# Patient Record
Sex: Female | Born: 1985 | Race: Black or African American | Hispanic: No | Marital: Single | Smoking: Former smoker
Health system: Southern US, Community
[De-identification: ages and names within clinical notes are randomized; demographics above are authoritative.]

## PROBLEM LIST (undated history)

## (undated) DIAGNOSIS — T7840XA Allergy, unspecified, initial encounter: Secondary | ICD-10-CM

## (undated) DIAGNOSIS — G43909 Migraine, unspecified, not intractable, without status migrainosus: Secondary | ICD-10-CM

## (undated) DIAGNOSIS — J45909 Unspecified asthma, uncomplicated: Secondary | ICD-10-CM

## (undated) HISTORY — DX: Unspecified asthma, uncomplicated: J45.909

## (undated) HISTORY — DX: Migraine, unspecified, not intractable, without status migrainosus: G43.909

## (undated) HISTORY — DX: Allergy, unspecified, initial encounter: T78.40XA

## (undated) HISTORY — PX: WISDOM TOOTH EXTRACTION: SHX21

---

## 2008-04-12 HISTORY — PX: SHOULDER SURGERY: SHX246

## 2012-02-24 ENCOUNTER — Ambulatory Visit: Payer: BC Managed Care – PPO

## 2012-02-24 ENCOUNTER — Ambulatory Visit (INDEPENDENT_AMBULATORY_CARE_PROVIDER_SITE_OTHER): Payer: BC Managed Care – PPO | Admitting: Family Medicine

## 2012-02-24 VITALS — BP 118/66 | HR 89 | Temp 98.5°F | Resp 16 | Ht 61.0 in | Wt 135.8 lb

## 2012-02-24 DIAGNOSIS — M79643 Pain in unspecified hand: Secondary | ICD-10-CM

## 2012-02-24 DIAGNOSIS — M79609 Pain in unspecified limb: Secondary | ICD-10-CM

## 2012-02-24 MED ORDER — TRAMADOL HCL 50 MG PO TABS: 50.0000 mg | ORAL_TABLET | Freq: Three times a day (TID) | ORAL | Status: DC | PRN

## 2012-02-24 NOTE — Patient Instructions (Signed)
Your have an appointment scheduled at [facility] ([phone]) on Tuesday 11/19 at 8 am.  Please arrive at 7:30 am.

## 2012-02-24 NOTE — Progress Notes (Signed)
[facility] [address] [place] [address] [phone]  Date:  02/24/2012   Name:  Heven Dansereau   DOB:  Jun 11, 1985   MRN:  [id number]  PCP:  No primary provider on file.    Chief Complaint: Hand Pain and Extremity Weakness   History of Present Illness:  Kamaia Quiocho is a 26 y.o. very pleasant female patient who presents with the following:  She is here today with a problem with her right hand.  She is concerned that she may have joint hypermobility syndrome as she has noted that many of her joints have hyperextension.   She is "tired all the time and in pain all the time."  This has been the case for several years.  She had surgery on her right shoulder in 2010 to "remove scar tissue pressing on my nerves."  Her procedure was done in [place].  She had PT after the surgery, but has not followed- up recently.  Her surgeon was with [facility] She has had persistent numbness in the ulnar side of her right hand/ forearm for several years.  For the last couple of days she has had more pain, weakness and numbness in her right hand, especially the ring and pinky fingers.  There was no known acute injury  She is right handed. However, she has found herself using her left hand as her right hand is bothering her so much.    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  Her work requires the use of both hands.  She does not think she can manage her job at the moment.   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  Otherwise she is generally healthy.   There is no chance of pregnancy per her report.    [facility] has been taking care of her most recently.  They saw her for a back and hip problem about a year ago- she also had PT that that time  She has not taken any medication at all for her pain so far.  She states that this is because she "has such a high tolerance for pain that I would have to take  too much" of an OTC medication such as ibuprofen.  She has used tramadol in the past with some success.   There is no problem list on file for this patient.   Past Medical History  Diagnosis Date  . Allergy   . Asthma     Past Surgical History  Procedure Date  . Shoulder surgery 2010    History  Substance Use Topics  . Smoking status: Current Every Day Smoker  . Smokeless tobacco: Not on file  . Alcohol Use: Yes    Family History  Problem Relation Age of Onset  . Hyperlipidemia Father   . Hypertension Maternal Grandfather     No Known Allergies  Medication list has been reviewed and updated.  No current outpatient prescriptions on file prior to visit.    Review of Systems:  As per HPI- otherwise negative.   Physical Examination: Filed Vitals:   02/24/12 1352  BP: 118/66  Pulse: 89  Temp: 98.5 F (36.9 C)  Resp: 16   Filed Vitals:   02/24/12 1352  Height: 5\' 1"  (1.549 m)  Weight: 135 lb 12.8 oz (61.598 kg)   Body mass index is 25.66 kg/(m^2). Ideal Body Weight: Weight  in (lb) to have BMI = 25: 132   GEN: WDWN, NAD, Non-toxic, A & O x 3, well- appearing HEENT: Atraumatic, Normocephalic. Neck supple. No masses, No LAD. Ears and Nose: No external deformity. CV: RRR, No M/G/R. No JVD. No thrill. No extra heart sounds. PULM: CTA B, no wheezes, crackles, rhonchi. No retractions. No resp. distress. No accessory muscle use. EXTR: No c/c/e NEURO Normal gait.  PSYCH: Normally interactive. Conversant. Not depressed or anxious appearing.  Calm demeanor.  She does have excessive extension at her MCP joints in both hands.   Her left hand is tender to palpation over the 4th and 5th MC, especially if they are squeezed together.  There is no swelling, redness or cellulitis.  She has good strength with the thumb and index/ long fingers, but poor effort to grip testing with the ring and pinky fingers.  Normal perfusion and capillary refill.  No sign of swelling or  tenderness in the arm to suggest a DVT. She notes that she has "different" sensation over the ulnar side of her right hand but she is able to perceive touch easily.     When standing her knees are visibly hyperextensible.   [facility] reading (PRIMARY) by  Dr. Patsy Lager. Right hand: negative Right wrist: negative for fracture  Make a fiberglass ulnar gutter splint for her right hand  Assessment and Plan: 1. Pain, hand  DG Wrist 2 Views Right, DG Hand 2 View Right, traMADol (ULTRAM) 50 MG tablet   I do suspect that Marjon may have joint hypermobility.  Her current pain may be due to a problem with her ulnar nerve vs a problem related to her hypermobile joints.  Placed her hand in an ulnar gutter splint which she may wear as needed to rest the hand.  She may take it off when she likes.  Took her out of work until she is seen by ortho- we made her an appt for this coming Tuesday.  Tramadol rx to use as needed for pain.  She knows that if she is not getting better in the next couple of days with splinting and ultram to give me a call- Sooner if worse.    Abbe Amsterdam, MD

## 2015-12-02 ENCOUNTER — Other Ambulatory Visit (HOSPITAL_COMMUNITY): Payer: Self-pay | Admitting: Obstetrics and Gynecology

## 2015-12-02 ENCOUNTER — Encounter (HOSPITAL_COMMUNITY): Payer: Self-pay | Admitting: Obstetrics and Gynecology

## 2015-12-02 DIAGNOSIS — Z3A19 19 weeks gestation of pregnancy: Secondary | ICD-10-CM

## 2015-12-02 DIAGNOSIS — Z3689 Encounter for other specified antenatal screening: Secondary | ICD-10-CM

## 2015-12-16 ENCOUNTER — Encounter (HOSPITAL_COMMUNITY): Payer: Self-pay | Admitting: Obstetrics and Gynecology

## 2015-12-23 ENCOUNTER — Ambulatory Visit (HOSPITAL_COMMUNITY)
Admission: RE | Admit: 2015-12-23 | Discharge: 2015-12-23 | Disposition: A | Discharge status: Home / Self Care | Payer: BLUE CROSS/BLUE SHIELD | Source: Ambulatory Visit | Attending: Obstetrics and Gynecology | Admitting: Obstetrics and Gynecology

## 2015-12-23 ENCOUNTER — Other Ambulatory Visit (HOSPITAL_COMMUNITY): Payer: Self-pay | Admitting: Obstetrics and Gynecology

## 2015-12-23 ENCOUNTER — Encounter (HOSPITAL_COMMUNITY): Payer: Self-pay

## 2015-12-23 DIAGNOSIS — R000 Tachycardia, unspecified: Secondary | ICD-10-CM

## 2015-12-23 DIAGNOSIS — Z3689 Encounter for other specified antenatal screening: Secondary | ICD-10-CM

## 2015-12-23 DIAGNOSIS — M357 Hypermobility syndrome: Secondary | ICD-10-CM

## 2015-12-23 DIAGNOSIS — Z3A19 19 weeks gestation of pregnancy: Secondary | ICD-10-CM

## 2015-12-23 DIAGNOSIS — O26892 Other specified pregnancy related conditions, second trimester: Secondary | ICD-10-CM | POA: Insufficient documentation

## 2015-12-23 DIAGNOSIS — Z36 Encounter for antenatal screening of mother: Secondary | ICD-10-CM | POA: Insufficient documentation

## 2015-12-23 DIAGNOSIS — O99332 Smoking (tobacco) complicating pregnancy, second trimester: Secondary | ICD-10-CM | POA: Insufficient documentation
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
## 2015-12-25 ENCOUNTER — Encounter (HOSPITAL_COMMUNITY): Payer: Self-pay

## 2015-12-25 ENCOUNTER — Other Ambulatory Visit (HOSPITAL_COMMUNITY): Payer: Self-pay

## 2015-12-29 ENCOUNTER — Encounter: Payer: Self-pay | Admitting: Physical Therapy

## 2015-12-29 ENCOUNTER — Ambulatory Visit: Payer: BLUE CROSS/BLUE SHIELD | Attending: Obstetrics and Gynecology | Admitting: Physical Therapy

## 2015-12-29 DIAGNOSIS — M5441 Lumbago with sciatica, right side: Secondary | ICD-10-CM | POA: Diagnosis present

## 2015-12-29 DIAGNOSIS — M6281 Muscle weakness (generalized): Secondary | ICD-10-CM | POA: Insufficient documentation

## 2015-12-29 NOTE — Patient Instructions (Addendum)
Posture - Sitting    Sit upright, head facing forward. Try using a roll to support lower back. Keep shoulders relaxed, and avoid rounded back. Keep hips level with knees. Avoid crossing legs for long periods.   Copyright  VHI. All rights reserved.  Sleeping on Side    Place pillow between knees. Use cervical support under neck and a roll around waist as needed.   Copyright  VHI. All rights reserved.  Standing    For prolonged standing, alternate placing one foot in front of the other or on a stool. Wear low-heeled shoes, and maintain good posture.   Copyright  VHI. All rights reserved.  Avoid Twisting    Avoid twisting or bending back. Pivot around using foot movements, and bend at knees if needed when reaching for articles.   Copyright  VHI. All rights reserved.  Stand to Sit / Sit to Stand    To sit: Bend knees to lower self onto front edge of chair, then scoot back on seat. To stand: Reverse sequence by placing one foot forward, and scoot to front of seat. Use rocking motion to stand up.  Copyright  VHI. All rights reserved.  Getting Into / Out of Car    Lower self onto seat, scoot back, then bring in one leg at a time. Reverse sequence to get out.   Copyright  VHI. All rights reserved.  Getting Into / Out of Bed    Lower self to lie down on one side by raising legs and lowering head at the same time. Use arms to assist moving without twisting. Bend both knees to roll onto back if desired. To sit up, start from lying on side, and use same move-ments in reverse. Keep trunk aligned with legs.   Copyright  VHI. All rights reserved.  [facility] [address], [address] [place], [place] [address] Phone # [phone] Fax [phone]

## 2015-12-29 NOTE — Therapy (Signed)
[facility] [address], [address] [place], [place], [address] Phone: [phone]   Fax:  [phone]  Physical Therapy Evaluation  Patient Details  Name: Natasha Pena MRN: [id number] Date of Birth: 04/05/1986 Referring Provider: Dr. Dahlia Bailiffarren Wright  Encounter Date: 12/29/2015      PT End of Session - 12/29/15 2249    Visit Number 1   Date for PT Re-Evaluation 02/23/16   PT Start Time 1445   PT Stop Time 1530   PT Time Calculation (min) 45 min   Activity Tolerance Patient tolerated treatment well   Behavior During Therapy [facility] for tasks assessed/performed      Past Medical History:  Diagnosis Date  . Allergy   . Asthma     Past Surgical History:  Procedure Laterality Date  . SHOULDER SURGERY  2010    There were no vitals filed for this visit.       Subjective Assessment - 12/29/15 1455    Subjective Patient is 5 months pregnant. Patient muscles are always tight due to keeping the hypemobile joints from moving.  Patient reports her low back and right hip draws up it causes the nerves to affect her right side of body.  Patient has nerve pain that will make it dificult for her to use covers. Patient has pain with turning in bed. Patient reports her pain is worse with pregnancy. Patient works at [employer] and has to stand. Patient recently has not been working as much.  Patient is using a heart monitor due to rise of heart rate.  Patient is wearing a pelvic support brace that is heling some.    How long can you sit comfortably? 0 min; brings a plush blanket or pillow to sit on.    How long can you stand comfortably? uncomfortable   How long can you walk comfortably? uncomfortable   Patient Stated Goals reduction of pain.  Not falling in middle of the night.    Currently in Pain? Yes   Pain Score 10-Worst pain ever   Pain Location Hip  back   Pain Orientation Right   Pain Descriptors / Indicators  Aching;Sharp;Shooting;Constant;Stabbing;Burning   Pain Type Chronic pain   Pain Radiating Towards down right leg   Pain Onset More than a month ago   Pain Frequency Constant   Aggravating Factors  walking, sitting, standing, ice    Pain Relieving Factors home TENs unit   Multiple Pain Sites No            [facility] PT Assessment - 12/29/15 0001      Assessment   Medical Diagnosis Z34.02 encounter for supervision of normal first pregnanacy second trimester; M54.31 sciatica, right side   Referring Provider Dr. Dahlia Bailiffarren Wright   Onset Date/Surgical Date 07/29/15   Prior Therapy had 1 new patient consultation     Precautions   Precautions Other (comment)   Precaution Comments pregnancy     Restrictions   Weight Bearing Restrictions No     Balance Screen   Has the patient fallen in the past 6 months Yes   How many times? 3   Has the patient had a decrease in activity level because of a fear of falling?  Yes  falls due to legs giving way   Is the patient reluctant to leave their home because of a fear of falling?  No     Home Nurse, mental healthnvironment   Living Environment Private residence   Living Arrangements Other relatives  grandparents   Available Help at Discharge Family   Type of Home House   Home Access Stairs to enter   Home Layout Two level     Prior Function   Level of Independence Independent with basic ADLs   Vocation Part time employment   Vocation Requirements standing     Cognition   Overall Cognitive Status Within Functional Limits for tasks assessed     Observation/Other Assessments   Focus on Therapeutic Outcomes (FOTO)  72% limitation  52% limitation     Posture/Postural Control   Posture/Postural Control Postural limitations   Posture Comments pregnancy     ROM / Strength   AROM / PROM / Strength AROM;Strength     AROM   Overall AROM Comments lumbar right sidebending decreased by 25%     Palpation   SI assessment  right ilium rotated anterioly in standing    Palpation comment tenderness located in right quadratus, right obturator internits, gluteus medius; and hamstring                 Pelvic Floor Special Questions - 12/29/15 0001    Prior Pregnancies No   Currently Sexually Active No                  PT Education - 12/29/15 2248    Education provided Yes   Education Details correct body mechanics with home tasks   Person(s) Educated Patient   Methods Explanation;Handout   Comprehension Returned demonstration;Verbalized understanding          PT Short Term Goals - 12/29/15 2253      PT SHORT TERM GOAL #1   Title indepedent with initial HEP   Time 4   Period Weeks   Status New     PT SHORT TERM GOAL #2   Title understand correct body mechanics to protect her back and joints   Time 4   Period Weeks           PT Long Term Goals - 12/29/15 1523      PT LONG TERM GOAL #1   Title independent with HEP   Time 8   Period Weeks   Status New     PT LONG TERM GOAL #2   Title understand ways to manage pain through pregnancy   Time 8   Period Weeks   Status New     PT LONG TERM GOAL #3   Title pain is moderate to minimal so she is able to walk her dog   Time 8   Period Weeks   Status New     PT LONG TERM GOAL #4   Title sit with pelvis in correct alignment for 30-40 min with pain at moderate to minimal    Time 8   Period Weeks   Status New     PT LONG TERM GOAL #5   Title not have to take medicine to take the edge off the pain due to her understanding ways to manage her pain   Time 8   Period Weeks   Status New               Plan - 12/29/15 1528    Clinical Impression Statement Patient is a 30 year old female with right sided sciatica that is worse with her pregnancy.  She is 5 months pregnant.  Patient reports a history of loose ligaments and joints and difficulty with keeping stability.  Patient pain level is 9/10 on the  right buttocks down the right leg.  Her pelvis is rotated  anteriorly on the right.  Palpable tenderness located in right obturator internist, right quadratus and gluteus medius.  Patient has to work less due to increase pain with standing.  Patient is not able to sit longer than 30 minutes and not on a hard surface.  Patient wears a SI belt to keep her pelvis in correct alignment.  Patient of moderately complex evaluation due to an evoving condition and comorbidities such as pregnancy, laxity of joint, and pain that makes it difficult to work that will impact care provided.    Rehab Potential Good   Clinical Impairments Affecting Rehab Potential pregnant   PT Frequency 1x / week   PT Duration 8 weeks   PT Treatment/Interventions Therapeutic activities;Therapeutic exercise;Neuromuscular re-education;Patient/family education;Manual techniques;Energy conservation;Taping   PT Next Visit Plan conservation of energy; soft tissue work; pelvic floor strengthening, isometric for core and hips for stability   PT Home Exercise Plan pelvic floor strength, hip isometric   Recommended Other Services none   Consulted and Agree with Plan of Care Patient      Patient will benefit from skilled therapeutic intervention in order to improve the following deficits and impairments:  Pain, Decreased endurance, Decreased activity tolerance, Decreased strength, Increased muscle spasms, Hypermobility  Visit Diagnosis: Muscle weakness (generalized) - Plan: PT plan of care cert/re-cert  Right-sided low back pain with right-sided sciatica - Plan: PT plan of care cert/re-cert     Problem List There are no active problems to display for this patient.   Eulis Foster, PT 12/29/15 10:59 PM   [facility] [address], [address] [place], [place], [address] Phone: [phone]   Fax:  [phone]  Name: Natasha Pena MRN: [id number] Date of Birth: March 01, 1986

## 2016-01-02 ENCOUNTER — Encounter (HOSPITAL_COMMUNITY): Payer: Self-pay

## 2016-01-02 ENCOUNTER — Ambulatory Visit (HOSPITAL_COMMUNITY)
Admission: RE | Admit: 2016-01-02 | Discharge: 2016-01-02 | Disposition: A | Discharge status: Home / Self Care | Payer: BLUE CROSS/BLUE SHIELD | Source: Ambulatory Visit | Attending: Obstetrics and Gynecology | Admitting: Obstetrics and Gynecology

## 2016-01-07 ENCOUNTER — Encounter: Payer: BLUE CROSS/BLUE SHIELD | Admitting: Physical Therapy

## 2016-01-08 ENCOUNTER — Other Ambulatory Visit (HOSPITAL_COMMUNITY): Payer: Self-pay | Admitting: *Deleted

## 2016-01-08 DIAGNOSIS — O283 Abnormal ultrasonic finding on antenatal screening of mother: Secondary | ICD-10-CM

## 2016-01-09 ENCOUNTER — Encounter (HOSPITAL_COMMUNITY): Payer: Self-pay

## 2016-01-09 ENCOUNTER — Ambulatory Visit (HOSPITAL_COMMUNITY)
Admission: RE | Admit: 2016-01-09 | Discharge: 2016-01-09 | Disposition: A | Discharge status: Home / Self Care | Payer: BLUE CROSS/BLUE SHIELD | Source: Ambulatory Visit | Attending: Obstetrics and Gynecology | Admitting: Obstetrics and Gynecology

## 2016-01-09 DIAGNOSIS — Z3A21 21 weeks gestation of pregnancy: Secondary | ICD-10-CM | POA: Insufficient documentation

## 2016-01-09 DIAGNOSIS — R000 Tachycardia, unspecified: Secondary | ICD-10-CM

## 2016-01-09 DIAGNOSIS — Z36 Encounter for antenatal screening of mother: Secondary | ICD-10-CM | POA: Insufficient documentation

## 2016-01-09 DIAGNOSIS — M357 Hypermobility syndrome: Secondary | ICD-10-CM

## 2016-01-09 DIAGNOSIS — O99332 Smoking (tobacco) complicating pregnancy, second trimester: Secondary | ICD-10-CM | POA: Insufficient documentation

## 2016-01-13 ENCOUNTER — Other Ambulatory Visit (HOSPITAL_COMMUNITY): Payer: Self-pay | Admitting: *Deleted

## 2016-01-13 DIAGNOSIS — IMO0002 Reserved for concepts with insufficient information to code with codable children: Secondary | ICD-10-CM

## 2016-01-13 DIAGNOSIS — Z0489 Encounter for examination and observation for other specified reasons: Secondary | ICD-10-CM

## 2016-01-14 ENCOUNTER — Encounter: Payer: BLUE CROSS/BLUE SHIELD | Admitting: Physical Therapy

## 2016-01-19 ENCOUNTER — Ambulatory Visit: Payer: BLUE CROSS/BLUE SHIELD | Attending: Obstetrics and Gynecology | Admitting: Physical Therapy

## 2016-01-19 DIAGNOSIS — M5441 Lumbago with sciatica, right side: Secondary | ICD-10-CM | POA: Insufficient documentation

## 2016-01-19 DIAGNOSIS — M6281 Muscle weakness (generalized): Secondary | ICD-10-CM | POA: Insufficient documentation

## 2016-01-19 DIAGNOSIS — G8929 Other chronic pain: Secondary | ICD-10-CM | POA: Insufficient documentation

## 2016-01-20 ENCOUNTER — Encounter: Payer: BLUE CROSS/BLUE SHIELD | Admitting: Physical Therapy

## 2016-01-21 ENCOUNTER — Encounter: Payer: BLUE CROSS/BLUE SHIELD | Admitting: Physical Therapy

## 2016-01-23 ENCOUNTER — Ambulatory Visit (HOSPITAL_COMMUNITY)
Admission: RE | Admit: 2016-01-23 | Discharge: 2016-01-23 | Disposition: A | Discharge status: Home / Self Care | Payer: BLUE CROSS/BLUE SHIELD | Source: Ambulatory Visit | Attending: Obstetrics and Gynecology | Admitting: Obstetrics and Gynecology

## 2016-01-23 ENCOUNTER — Ambulatory Visit (HOSPITAL_COMMUNITY): Payer: BLUE CROSS/BLUE SHIELD

## 2016-01-23 ENCOUNTER — Encounter (HOSPITAL_COMMUNITY): Payer: Self-pay

## 2016-01-23 VITALS — BP 126/53 | HR 95 | Wt 167.0 lb

## 2016-01-23 DIAGNOSIS — Z3A23 23 weeks gestation of pregnancy: Secondary | ICD-10-CM | POA: Insufficient documentation

## 2016-01-23 DIAGNOSIS — Z0489 Encounter for examination and observation for other specified reasons: Secondary | ICD-10-CM

## 2016-01-23 DIAGNOSIS — IMO0002 Reserved for concepts with insufficient information to code with codable children: Secondary | ICD-10-CM

## 2016-01-23 DIAGNOSIS — Z315 Encounter for genetic counseling: Secondary | ICD-10-CM | POA: Insufficient documentation

## 2016-01-23 DIAGNOSIS — O99332 Smoking (tobacco) complicating pregnancy, second trimester: Secondary | ICD-10-CM | POA: Insufficient documentation

## 2016-01-23 DIAGNOSIS — O283 Abnormal ultrasonic finding on antenatal screening of mother: Secondary | ICD-10-CM

## 2016-01-23 DIAGNOSIS — O2692 Pregnancy related conditions, unspecified, second trimester: Secondary | ICD-10-CM | POA: Insufficient documentation
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
## 2016-01-26 ENCOUNTER — Ambulatory Visit: Payer: BLUE CROSS/BLUE SHIELD | Admitting: Physical Therapy

## 2016-01-26 ENCOUNTER — Encounter: Payer: Self-pay | Admitting: Physical Therapy

## 2016-01-26 DIAGNOSIS — M5441 Lumbago with sciatica, right side: Secondary | ICD-10-CM | POA: Diagnosis present

## 2016-01-26 DIAGNOSIS — G8929 Other chronic pain: Secondary | ICD-10-CM | POA: Diagnosis present

## 2016-01-26 DIAGNOSIS — M6281 Muscle weakness (generalized): Secondary | ICD-10-CM

## 2016-01-26 NOTE — Therapy (Signed)
[facility] [address] [place], [place], [address] Phone: [phone]   Fax:  [phone]  Physical Therapy Treatment  Patient Details  Name: Natasha Pena MRN: [id number] Date of Birth: 05/11/1985 Referring Provider: Dr. Dahlia Bailiffarren Wright  Encounter Date: 01/26/2016      PT End of Session - 01/26/16 1022    Visit Number 2   Date for PT Re-Evaluation 02/23/16   PT Start Time 1016   PT Stop Time 1055   PT Time Calculation (min) 39 min   Activity Tolerance Patient tolerated treatment well   Behavior During Therapy [facility] for tasks assessed/performed      Past Medical History:  Diagnosis Date  . Allergy   . Asthma     Past Surgical History:  Procedure Laterality Date  . SHOULDER SURGERY  2010    There were no vitals filed for this visit.      Subjective Assessment - 01/26/16 1020    Subjective I have not been able to come to therapy for the last few visits due to transportation.    How long can you sit comfortably? 0 min; brings a plush blanket or pillow to sit on.    How long can you stand comfortably? uncomfortable   How long can you walk comfortably? uncomfortable   Patient Stated Goals reduction of pain.  Not falling in middle of the night.    Currently in Pain? Yes   Pain Score 10-Worst pain ever   Pain Location Pelvis   Pain Orientation Mid   Pain Descriptors / Indicators Aching;Sharp;Shooting;Constant;Stabbing;Burning   Pain Type Chronic pain   Pain Radiating Towards down right leg   Pain Onset More than a month ago   Pain Frequency Constant   Aggravating Factors  walking, sitting, standing, ice   Pain Relieving Factors home TENS unit   Multiple Pain Sites No                         [facility] Adult PT Treatment/Exercise - 01/26/16 0001      Therapeutic Activites    Therapeutic Activities ADL's   ADL's sleep on side with towel roll under cervicatl, rolled towel under abdomen,  pillow under top leg to keep hi pin neutral. Sit with u-shaped towel to decrease strain in pelvic floor     Exercises   Exercises Other Exercises   Other Exercises  pelvic floor contraction in sidely hold 5 sec 15 times     Manual Therapy   Manual Therapy Soft tissue mobilization   Soft tissue mobilization to bil. levator ani and coccygeus in left sidely                PT Education - 01/26/16 1056    Education provided Yes   Education Details body mechanics with sitting, sleeping, and getting in and out of bed, pelvic floor contraction, hip isometrics   Person(s) Educated Patient   Methods Explanation;Demonstration;Verbal cues;Handout   Comprehension Returned demonstration;Verbalized understanding          PT Short Term Goals - 01/26/16 1101      PT SHORT TERM GOAL #1   Title indepedent with initial HEP   Period Weeks   Status On-going  still learning     PT SHORT TERM GOAL #2   Title understand correct body mechanics to protect her back and joints   Time 4   Period Weeks   Status Achieved  PT Long Term Goals - 12/29/15 1523      PT LONG TERM GOAL #1   Title independent with HEP   Time 8   Period Weeks   Status New     PT LONG TERM GOAL #2   Title understand ways to manage pain through pregnancy   Time 8   Period Weeks   Status New     PT LONG TERM GOAL #3   Title pain is moderate to minimal so she is able to walk her dog   Time 8   Period Weeks   Status New     PT LONG TERM GOAL #4   Title sit with pelvis in correct alignment for 30-40 min with pain at moderate to minimal    Time 8   Period Weeks   Status New     PT LONG TERM GOAL #5   Title not have to take medicine to take the edge off the pain due to her understanding ways to manage her pain   Time 8   Period Weeks   Status New               Plan - 01/26/16 1057    Clinical Impression Statement Patient had reduction in pain after therapy. Patient understands  correct body mechanics in sitting and laying in bed to reduce pain and tension on her joints.  Patient had trigger points in her pelvic floor muscles and knows how to perform soft tissue work to the pelvic floor muscles to reduce tension and pain.  Patient will benefit from skilled therapy to reduce pain and increased muscle tone for stability.    Rehab Potential Good   Clinical Impairments Affecting Rehab Potential pregnant   PT Frequency 1x / week   PT Duration 8 weeks   PT Treatment/Interventions Therapeutic activities;Therapeutic exercise;Neuromuscular re-education;Patient/family education;Manual techniques;Energy conservation;Taping   PT Next Visit Plan conservation of energy; soft tissue work; pelvic floor strengthening, isometric for core and hips for stability   PT Home Exercise Plan progress as needed   Recommended Other Services none   Consulted and Agree with Plan of Care Patient      Patient will benefit from skilled therapeutic intervention in order to improve the following deficits and impairments:  Pain, Decreased endurance, Decreased activity tolerance, Decreased strength, Increased muscle spasms, Hypermobility  Visit Diagnosis: Muscle weakness (generalized)  Chronic right-sided low back pain with right-sided sciatica     Problem List Patient Active Problem List   Diagnosis Date Noted  . Encounter for procreative genetic counseling    Eulis Foster, PT 01/26/16 11:02 AM   [facility] [address], [address] [place], [place], [address] Phone: [phone]   Fax:  [phone]  Name: Natasha Pena MRN: [id number] Date of Birth: 20-Mar-1986

## 2016-01-26 NOTE — Patient Instructions (Addendum)
Slow Contraction: Gravity Eliminated (Side-Lying)    Lie on left side, hips and knees slightly bent. Slowly squeeze pelvic floor for __5_ seconds. Rest for 5___ seconds. Repeat _10__ times. Do _3__ times a day.   Copyright  VHI. All rights reserved.   Quick Contraction: Gravity Eliminated (Side-Lying)    Lie on left side, hips and knees slightly bent. Quickly squeeze then fully relax pelvic floor. Perform _1__ sets of _5__. Rest for __1_ seconds between sets. Do _3__ times a day.   Copyright  VHI. All rights reserved.  Levator Scapula Stretch, Sitting    Sit, one hand tucked under hip on side to be stretched, other hand over top of head. Turn head toward other side and look down. Use hand on head to gently stretch neck in that position. Hold _15__ seconds. Repeat _2__ times per session. Do _1__ sessions per day.  Copyright  VHI. All rights reserved.  Scalene Stretch, Sitting    Sit, one hand tucked under hip on side to be stretched, other hand over top of head. Gently pull head to side and backwards. Hold _15__ seconds. For more stretch, lean body in direction of head pull. Repeat _2__ times per session. Do _2__ sessions per day.  Copyright  VHI. All rights reserved.  ADDUCTION: Isometric   Do while sitting in a chair. With ball between knees, squeeze them inward. Hold _5__ seconds. Complete _1__ sets of _10__ repetitions. Perform _1-2__ sessions per day.  http://gtsc.exer.us/124   Copyright  VHI. All rights reserved.  ABDUCTION: Sitting - Resistance Band (Active)    Sit with feet flat. Lift right leg slightly and, against yellow resistance band, draw it out to side. Complete _1__ sets of _10__ repetitions. Perform _2__ sessions per day. Bring both knees apart compared to one leg.  Copyright  VHI. All rights reserved.  Stand to Sit / Sit to Stand    To sit: Bend knees to lower self onto front edge of chair, then scoot back on seat. To stand: Reverse  sequence by placing one foot forward, and scoot to front of seat. Use rocking motion to stand up.  Copyright  VHI. All rights reserved.  Getting Into / Out of Bed    Lower self to lie down on one side by raising legs and lowering head at the same time. Use arms to assist moving without twisting. Bend both knees to roll onto back if desired. To sit up, start from lying on side, and use same move-ments in reverse. Keep trunk aligned with legs.   Copyright  VHI. All rights reserved.  Getting Into / Out of Car    Lower self onto seat, scoot back, then bring in one leg at a time. Reverse sequence to get out.   Copyright  VHI. All rights reserved.  Log Roll    Lying on back, bend left knee and place left arm across chest. Roll all in one movement to the right. Reverse to roll to the left. Always move as one unit.   Copyright  VHI. All rights reserved.  [facility] [address], [address] [place], [place] [address] Phone # [phone] Fax [phone]

## 2016-01-27 ENCOUNTER — Encounter: Payer: Self-pay | Admitting: Physical Therapy

## 2016-01-27 ENCOUNTER — Ambulatory Visit: Payer: BLUE CROSS/BLUE SHIELD | Admitting: Physical Therapy

## 2016-01-27 DIAGNOSIS — M6281 Muscle weakness (generalized): Secondary | ICD-10-CM | POA: Diagnosis not present

## 2016-01-27 DIAGNOSIS — M5441 Lumbago with sciatica, right side: Secondary | ICD-10-CM

## 2016-01-27 DIAGNOSIS — G8929 Other chronic pain: Secondary | ICD-10-CM

## 2016-01-27 NOTE — Therapy (Addendum)
[facility] [address] [place], [place], [address] Phone: [phone]   Fax:  [phone]  Physical Therapy Treatment  Patient Details  Name: Natasha Pena MRN: [id number] Date of Birth: 1986/04/11 Referring Provider: Dr. Rutha Bouchard  Encounter Date: 01/27/2016      PT End of Session - 01/27/16 1019    Visit Number 3   Date for PT Re-Evaluation 02/23/16   PT Start Time 0930   PT Stop Time 1013   PT Time Calculation (min) 43 min   Activity Tolerance Patient tolerated treatment well   Behavior During Therapy [facility] for tasks assessed/performed      Past Medical History:  Diagnosis Date  . Allergy   . Asthma     Past Surgical History:  Procedure Laterality Date  . SHOULDER SURGERY  2010    There were no vitals filed for this visit.      Subjective Assessment - 01/27/16 0931    Subjective I had some cramping and I did the kegel and helped some. I am seeing the doctor today due to the increased cramps and the bleeding over the weekend.  I feel like I am having [place].    How long can you sit comfortably? 0 min; brings a plush blanket or pillow to sit on.    How long can you stand comfortably? uncomfortable   How long can you walk comfortably? uncomfortable   Patient Stated Goals reduction of pain.  Not falling in middle of the night.    Currently in Pain? Yes   Pain Score 8    Pain Location Pelvis   Pain Orientation Mid   Pain Descriptors / Indicators Aching;Sharp;Shooting;Constant;Stabbing;Burning   Pain Type Chronic pain   Pain Radiating Towards down right leg   Pain Onset More than a month ago   Pain Frequency Constant   Aggravating Factors  walking, sitting, standing, ice   Pain Relieving Factors rest   Multiple Pain Sites No                         [facility] Adult PT Treatment/Exercise - 01/27/16 0001      Self-Care   Self-Care Other Self-Care Comments  using a u-shaped  pillow with sleeping     Therapeutic Activites    Therapeutic Activities ADL's   ADL's breathing and not holding breath with hardest part of activity to decrease strain on pelvic floor, sidely to sitting, using a pillow that is u-shaped to keep correct spinal alignment, no sitting more than 30 min and standing more than 10 min.      Manual Therapy   Manual Therapy Soft tissue mobilization   Soft tissue mobilization bil. piriformis, bil. lumbar thoracic paraspinals, bil. quadratus lumborum                PT Education - 01/27/16 1015    Education provided Yes   Education Details not holding breath, moving without valsalva manuever, pelvic floor contraction   Person(s) Educated Patient   Methods Explanation;Demonstration;Verbal cues;Handout   Comprehension Returned demonstration;Verbalized understanding          PT Short Term Goals - 01/26/16 1101      PT SHORT TERM GOAL #1   Title indepedent with initial HEP   Period Weeks   Status On-going  still learning     PT SHORT TERM GOAL #2   Title understand correct body mechanics to protect her back and joints  Time 4   Period Weeks   Status Achieved           PT Long Term Goals - 12/29/15 1523      PT LONG TERM GOAL #1   Title independent with HEP   Time 8   Period Weeks   Status New     PT LONG TERM GOAL #2   Title understand ways to manage pain through pregnancy   Time 8   Period Weeks   Status New     PT LONG TERM GOAL #3   Title pain is moderate to minimal so she is able to walk her dog   Time 8   Period Weeks   Status New     PT LONG TERM GOAL #4   Title sit with pelvis in correct alignment for 30-40 min with pain at moderate to minimal    Time 8   Period Weeks   Status New     PT LONG TERM GOAL #5   Title not have to take medicine to take the edge off the pain due to her understanding ways to manage her pain   Time 8   Period Weeks   Status New               Plan - 01/27/16  1019    Clinical Impression Statement Patient is seeing her MD today due to the bleeding over the weeking, increased cramping in pelvic floor, and her feeling she is having possible [employer].  Therapist guided her on guidlines on not standing or sitting too long and resting.  Instructed patient to not do a valsalva maneuver  to put increased pressuer on the pelvic floor.  Patient will benefit from physical therapy to  reduce pain and tension  and increased muscle tone for stability.    Rehab Potential Good   Clinical Impairments Affecting Rehab Potential pregnant   PT Frequency 1x / week   PT Duration 8 weeks   PT Treatment/Interventions Therapeutic activities;Therapeutic exercise;Neuromuscular re-education;Patient/family education;Manual techniques;Energy conservation;Taping   PT Next Visit Plan see what MD says before she is to be treated    PT Home Exercise Plan progress as needed   Consulted and Agree with Plan of Care Patient      Patient will benefit from skilled therapeutic intervention in order to improve the following deficits and impairments:  Pain, Decreased endurance, Decreased activity tolerance, Decreased strength, Increased muscle spasms, Hypermobility  Visit Diagnosis: Muscle weakness (generalized)  Chronic right-sided low back pain with right-sided sciatica     Problem List Patient Active Problem List   Diagnosis Date Noted  . Encounter for procreative genetic counseling     Earlie Counts, PT 01/27/16 10:24 AM   [facility] [address] [place], [place], [address] Phone: [phone]   Fax:  [phone]  Name: Natasha Pena MRN: [id number] Date of Birth: 03/16/1986  PHYSICAL THERAPY DISCHARGE SUMMARY  Visits from Start of Care: 3  Current functional level related to goals / functional outcomes: See above.  Patient has not returned since 01/27/2016. Patient was having issues with  transportation. .   Remaining deficits: See above.    Education / Equipment: HEP  Plan:  Patient goals were not met. Patient is being discharged due to not returning since the last visit.  Thank you for the referral. Earlie Counts, PT 03/11/16 8:16 AM  ?????

## 2016-01-27 NOTE — Patient Instructions (Addendum)
Slow Contraction: Gravity Eliminated (Side-Lying)    Lie on left side, hips and knees slightly bent. Slowly squeeze pelvic floor for _5__ seconds. Rest for __5_ seconds. Repeat 5___ times. Do __3_ times a day.   Copyright  VHI. All rights reserved.  Do not hold your breath.  Take deep breaths that will relax the body.  Hold the breath will put pressure on the pelvic floor.  Proper Breathing and Posture  It is important that you breathe normally while doing your exercises, and avoid holding your breath. Maintain good posture and proper head alignment when performing the exercises.   Copyright  VHI. All rights reserved.  Abdominal Breathing Standing or Sitting    Place one hand over upper chest, other over stomach. Keep shoulders relaxed. Breathe in slowly, letting stomach move out. Keep upper chest quiet. Exhale gently through pursed lips, pulling abdominal muscles in. Breathe out for at least twice as long as you breathe in. Remember: Breathe in - tummy out, Breathe out - tummy in. Repeat __5__ times, _2___ times daily.  Copyright  VHI. All rights reserved.  [facility] [address], [address] [place], [place] [address] Phone # [phone] Fax [phone]

## 2016-01-28 ENCOUNTER — Encounter: Payer: BLUE CROSS/BLUE SHIELD | Admitting: Physical Therapy

## 2016-02-02 ENCOUNTER — Ambulatory Visit: Payer: BLUE CROSS/BLUE SHIELD | Admitting: Physical Therapy

## 2016-02-03 ENCOUNTER — Encounter: Payer: BLUE CROSS/BLUE SHIELD | Admitting: Physical Therapy

## 2016-02-04 ENCOUNTER — Encounter: Payer: BLUE CROSS/BLUE SHIELD | Admitting: Physical Therapy

## 2016-02-09 ENCOUNTER — Encounter: Payer: BLUE CROSS/BLUE SHIELD | Admitting: Physical Therapy

## 2016-02-11 ENCOUNTER — Encounter: Payer: BLUE CROSS/BLUE SHIELD | Admitting: Physical Therapy

## 2016-02-16 ENCOUNTER — Encounter: Payer: BLUE CROSS/BLUE SHIELD | Admitting: Physical Therapy

## 2016-02-17 ENCOUNTER — Encounter: Payer: BLUE CROSS/BLUE SHIELD | Admitting: Physical Therapy

## 2016-02-18 ENCOUNTER — Encounter: Payer: BLUE CROSS/BLUE SHIELD | Admitting: Physical Therapy

## 2016-10-27 ENCOUNTER — Encounter (HOSPITAL_COMMUNITY): Payer: Self-pay

## 2018-05-08 ENCOUNTER — Encounter: Payer: Self-pay | Admitting: *Deleted

## 2018-05-09 ENCOUNTER — Telehealth: Payer: Self-pay | Admitting: *Deleted

## 2018-05-09 ENCOUNTER — Ambulatory Visit: Payer: BLUE CROSS/BLUE SHIELD | Admitting: Neurology

## 2018-05-09 ENCOUNTER — Encounter

## 2018-05-09 NOTE — Telephone Encounter (Signed)
No showed new patient appointment. 

## 2018-05-10 ENCOUNTER — Encounter: Payer: Self-pay | Admitting: Neurology

## 2018-10-02 IMAGING — US US MFM OB FOLLOW-UP
1 series · 14 of 28 positions shown · non-contrast
Comparison: none

[Series 1: us mfm ob follow-up · 45 acquisitions, 14 frames shown]
[im 2/45]
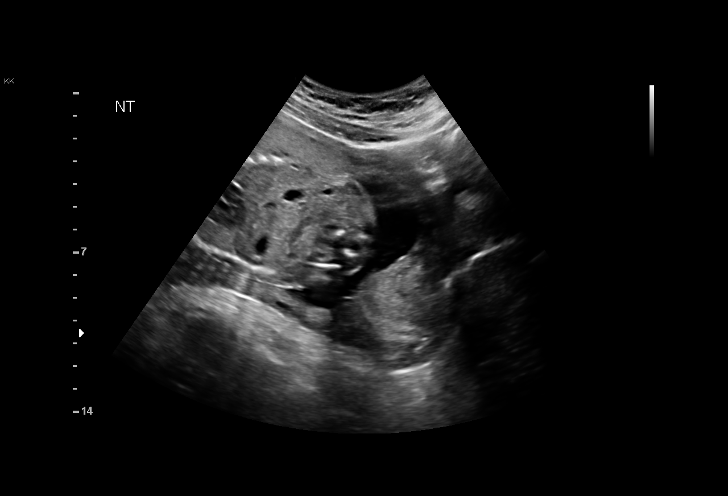
[im 5/45]
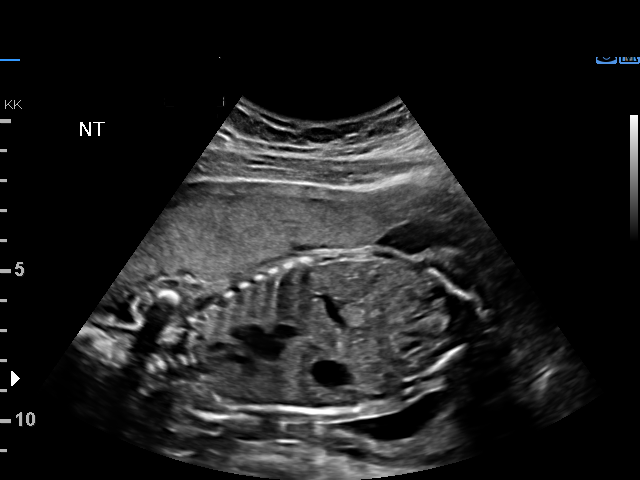
[im 9/45]
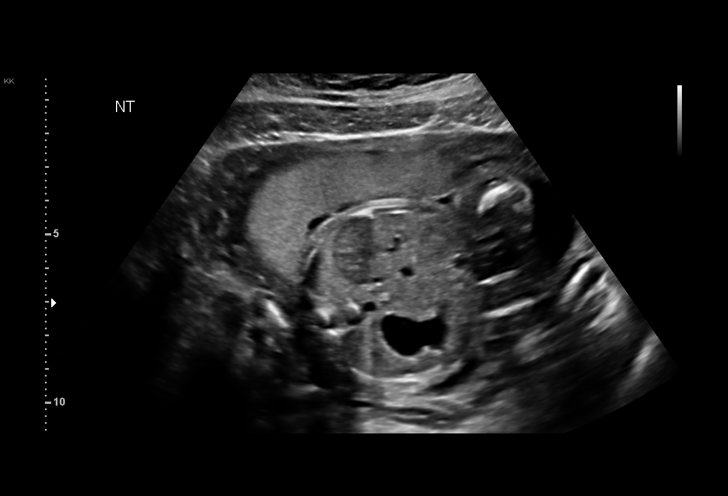
[im 12/45]
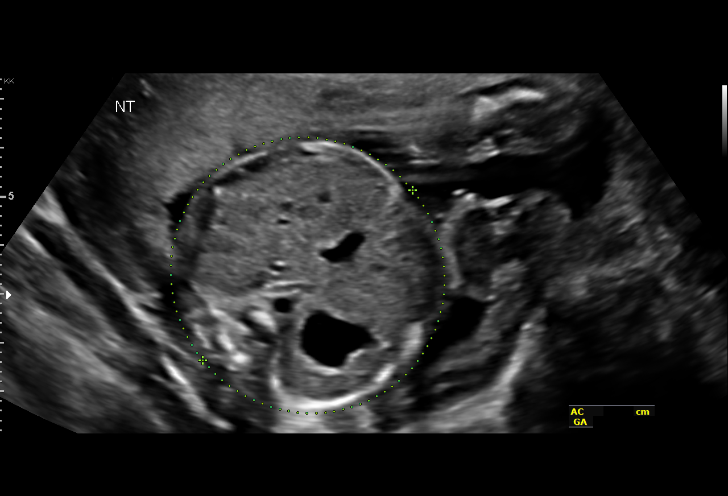
[im 15/45]
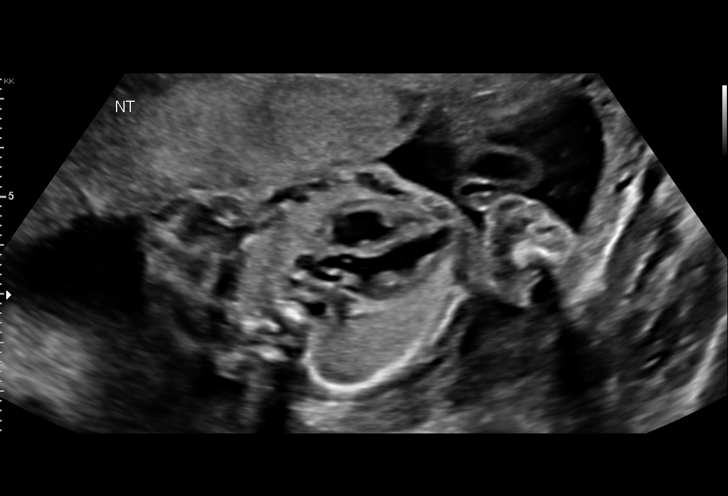
[im 18/45]
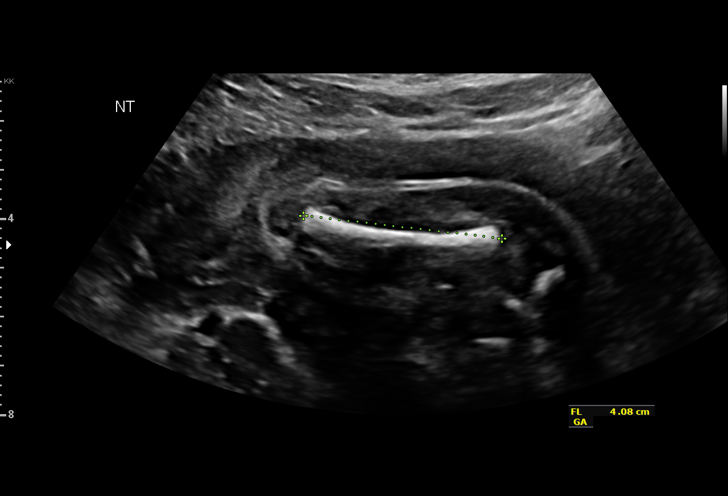
[im 22/45]
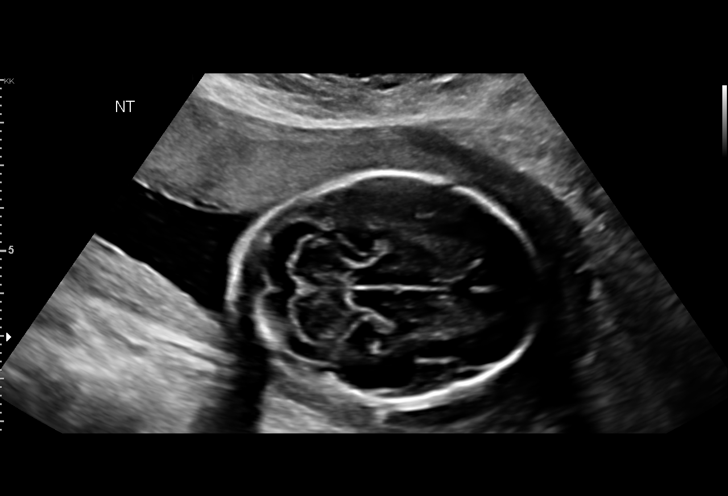
[im 25/45]
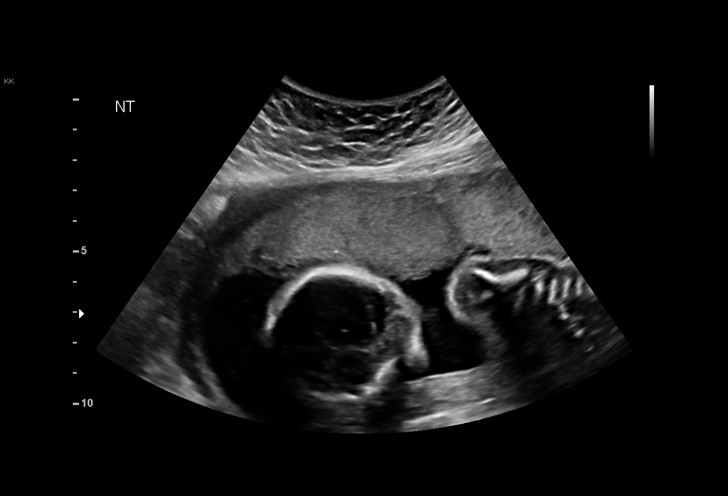
[im 28/45]
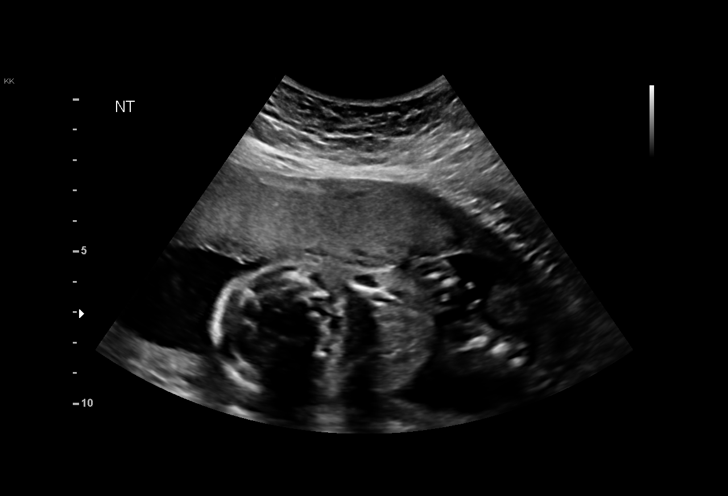
[im 31/45]
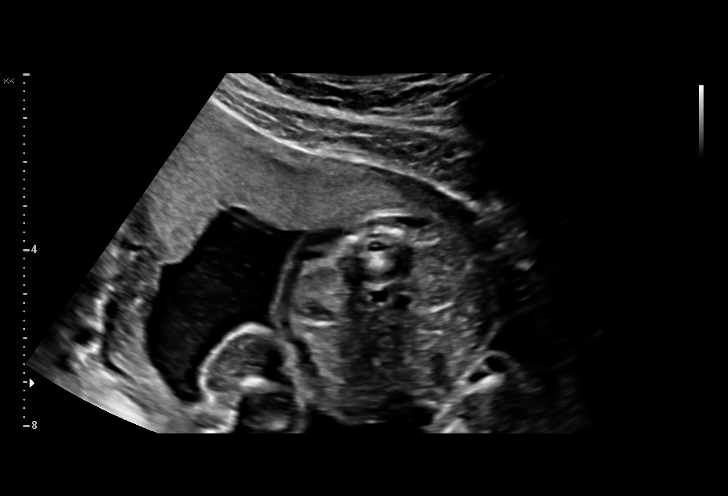
[im 35/45]
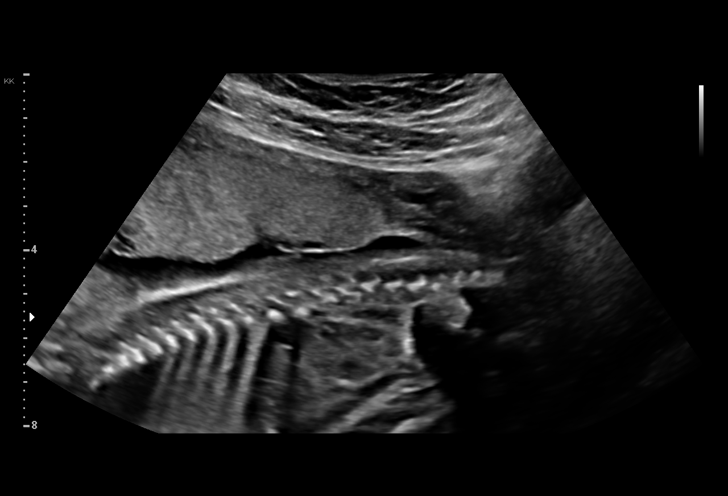
[im 38/45]
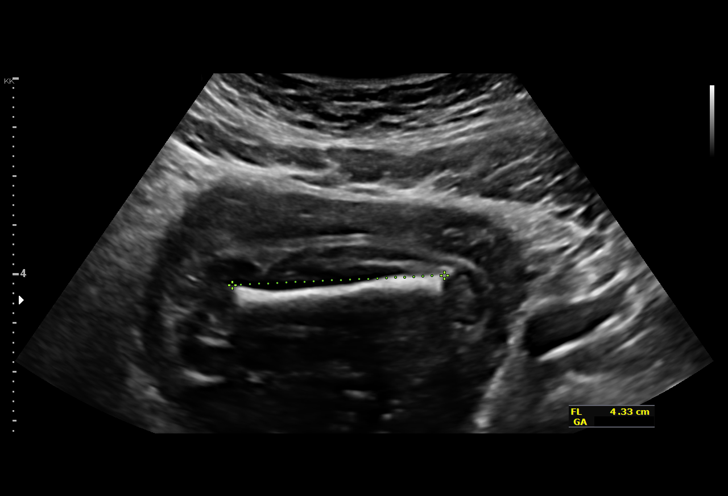
[im 41/45]
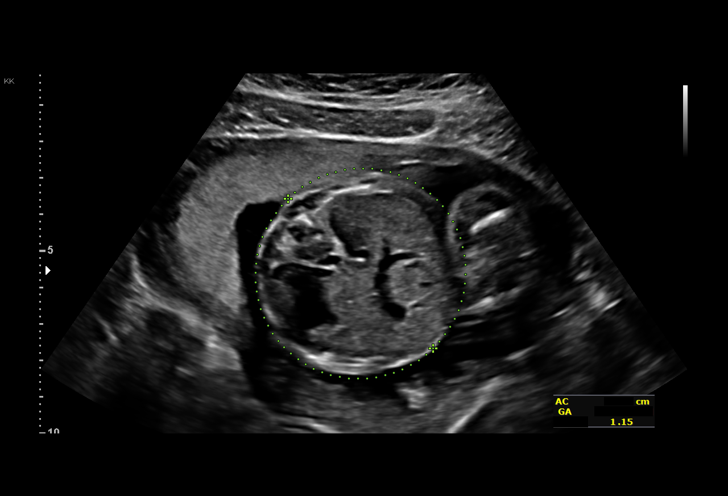
[im 45/45]
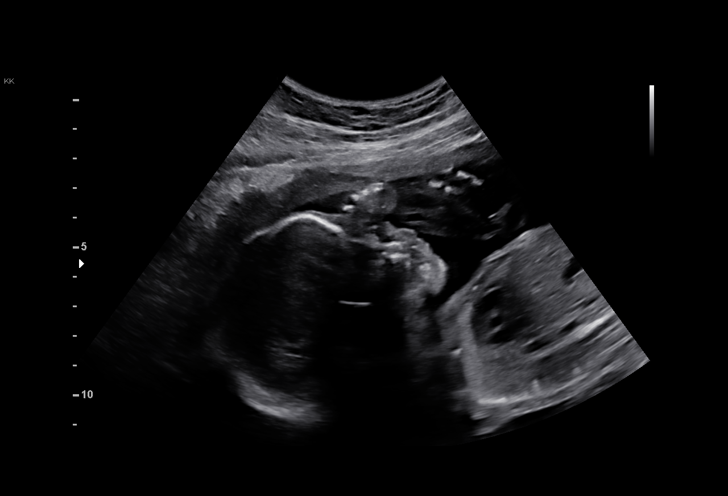

[14 of 28 positions shown; findings below may reference images not displayed]

[HOSPITAL],[HOSPITAL]

1  [facility] J[facility]ER            [id number]      [id number]     [id number]
Indications

23 weeks gestation of pregnancy
Medical complication of pregnancy
(Hypermobility syndrome, Tachycardia,
Possible Em Syndrome)
Detailed fetal anatomic survey                 Z36
Tobacco use complicating pregnancy,
second trimester
OB History

Blood Type:            Height:  5'1"   Weight (lb):  135      BMI:
Gravidity:    1         Term:   0        Prem:   0        SAB:   0
TOP:          0       Ectopic:  0        Living: 0
Fetal Evaluation

Num Of Fetuses:     1
Fetal Heart         165
Rate(bpm):
Cardiac Activity:   Observed
Presentation:       Breech
Placenta:           Anterior, above cervical os
P. Cord Insertion:  Previously Visualized

Amniotic Fluid
AFI FV:      Subjectively within normal limits

Largest Pocket(cm)
3.7
Biometry
BPD:      52.6  mm     G. Age:  22w 0d          4  %    CI:        65.43   %   70 - 86
FL/HC:      20.8   %   18.7 -
HC:      208.8  mm     G. Age:  23w 0d         15  %    HC/AC:      1.16       1.05 -
AC:      180.6  mm     G. Age:  22w 6d         22  %    FL/BPD:     82.7   %   71 - 87
FL:       43.5  mm     G. Age:  24w 2d         61  %    FL/AC:      24.1   %   20 - 24

Est. FW:     590  gm      1 lb 5 oz     49  %
Gestational Age

U/S Today:     23w 0d                                        EDD:   05/21/16
Best:          23w 4d    Det. By:   Early Ultrasound         EDD:   05/17/16
(10/31/15)
Anatomy

Cranium:               Appears normal         Aortic Arch:            Previously seen
Cavum:                 Appears normal         Ductal Arch:            Previously seen
Ventricles:            Appears normal         Diaphragm:              Appears normal
Choroid Plexus:        Previously seen        Stomach:                Appears normal, left
sided
Cerebellum:            Appears normal         Abdomen:                Appears normal
Posterior Fossa:       Appears normal         Abdominal Wall:         Previously seen
Nuchal Fold:           Previously seen        Cord Vessels:           Previously seen
Face:                  Orbits and profile     Kidneys:                Appear normal
previously seen
Lips:                  Previously seen        Bladder:                Appears normal
Thoracic:              Appears normal         Spine:                  Previously seen
Heart:                 Previously seen        Upper Extremities:      Previously seen
RVOT:                  Appears normal         Lower Extremities:      Previously seen
LVOT:                  Appears normal

Other:  Fetus appears to be a female. Heels, Right 5th digit, and Nasal bone
previously visualized. Technically difficult due to fetal position.
Cervix Uterus Adnexa

Cervix
Length:            3.9  cm.
Normal appearance by transabdominal scan.
Impression

Single IUP at 23w 4d
Possible hypermobility Em syndrome
Normal interval anatomy
Fetal growth is appropriate (49th %tile)
Cervical length 3.9 cm (transabdominal)
Normal amniotic fluid volume
Recommendations

Follow-up ultrasounds as clinically indicated.

## 2018-11-01 ENCOUNTER — Other Ambulatory Visit: Payer: Self-pay

## 2018-11-01 ENCOUNTER — Encounter: Payer: Self-pay | Admitting: *Deleted

## 2018-11-01 ENCOUNTER — Encounter: Payer: Self-pay | Admitting: Neurology

## 2018-11-01 ENCOUNTER — Ambulatory Visit: Payer: Medicaid Other | Admitting: Neurology

## 2018-11-01 ENCOUNTER — Telehealth: Payer: Self-pay | Admitting: Neurology

## 2018-11-01 VITALS — BP 122/72 | HR 74 | Temp 98.0°F | Ht 61.0 in | Wt 143.0 lb

## 2018-11-01 DIAGNOSIS — M79609 Pain in unspecified limb: Secondary | ICD-10-CM | POA: Diagnosis not present

## 2018-11-01 DIAGNOSIS — G43001 Migraine without aura, not intractable, with status migrainosus: Secondary | ICD-10-CM | POA: Insufficient documentation

## 2018-11-01 DIAGNOSIS — R2990 Unspecified symptoms and signs involving the nervous system: Secondary | ICD-10-CM

## 2018-11-01 DIAGNOSIS — R200 Anesthesia of skin: Secondary | ICD-10-CM

## 2018-11-01 DIAGNOSIS — R531 Weakness: Secondary | ICD-10-CM

## 2018-11-01 DIAGNOSIS — M792 Neuralgia and neuritis, unspecified: Secondary | ICD-10-CM

## 2018-11-01 DIAGNOSIS — R202 Paresthesia of skin: Secondary | ICD-10-CM

## 2018-11-01 DIAGNOSIS — G441 Vascular headache, not elsewhere classified: Secondary | ICD-10-CM

## 2018-11-01 DIAGNOSIS — H539 Unspecified visual disturbance: Secondary | ICD-10-CM

## 2018-11-01 MED ORDER — RIZATRIPTAN BENZOATE 10 MG PO TBDP: 10.0000 mg | ORAL_TABLET | ORAL | 11 refills | Status: AC | PRN

## 2018-11-01 NOTE — Telephone Encounter (Signed)
Medicaid order sent to GI. They obtain the auth and will reach out to the patient to schedule.

## 2018-11-01 NOTE — Patient Instructions (Addendum)
Blood work MRI of the brain and the cervical spine with and without contrast Rizatriptan: Please take one tablet at the onset of your headache. If it does not improve the symptoms please take one additional tablet. Do not take more then 2 tablets in 24hrs. Do not take use more then 2 to 3 times in a week. Start gabapentin We will discuss follow-up when we get the results back of all the blood and imaging.   Migraine Headache A migraine headache is an intense, throbbing pain on one side or both sides of the head. Migraine headaches may also cause other symptoms, such as nausea, vomiting, and sensitivity to light and noise. A migraine headache can last from 4 hours to 3 days. Talk with your doctor about what things may bring on (trigger) your migraine headaches. What are the causes? The exact cause of this condition is not known. However, a migraine may be caused when nerves in the brain become irritated and release chemicals that cause inflammation of blood vessels. This inflammation causes pain. This condition may be triggered or caused by:  Drinking alcohol.  Smoking.  Taking medicines, such as: ? Medicine used to treat chest pain (nitroglycerin). ? Birth control pills. ? Estrogen. ? Certain blood pressure medicines.  Eating or drinking products that contain nitrates, glutamate, aspartame, or tyramine. Aged cheeses, chocolate, or caffeine may also be triggers.  Doing physical activity. Other things that may trigger a migraine headache include:  Menstruation.  Pregnancy.  Hunger.  Stress.  Lack of sleep or too much sleep.  Weather changes.  Fatigue. What increases the risk? The following factors may make you more likely to experience migraine headaches:  Being a certain age. This condition is more common in people who are 33-58 years old.  Being female.  Having a family history of migraine headaches.  Being Caucasian.  Having a mental health condition, such as  depression or anxiety.  Being obese. What are the signs or symptoms? The main symptom of this condition is pulsating or throbbing pain. This pain may:  Happen in any area of the head, such as on one side or both sides.  Interfere with daily activities.  Get worse with physical activity.  Get worse with exposure to bright lights or loud noises. Other symptoms may include:  Nausea.  Vomiting.  Dizziness.  General sensitivity to bright lights, loud noises, or smells. Before you get a migraine headache, you may get warning signs (an aura). An aura may include:  Seeing flashing lights or having blind spots.  Seeing bright spots, halos, or zigzag lines.  Having tunnel vision or blurred vision.  Having numbness or a tingling feeling.  Having trouble talking.  Having muscle weakness. Some people have symptoms after a migraine headache (postdromal phase), such as:  Feeling tired.  Difficulty concentrating. How is this diagnosed? A migraine headache can be diagnosed based on:  Your symptoms.  A physical exam.  Tests, such as: ? CT scan or an MRI of the head. These imaging tests can help rule out other causes of headaches. ? Taking fluid from the spine (lumbar puncture) and analyzing it (cerebrospinal fluid analysis, or CSF analysis). How is this treated? This condition may be treated with medicines that:  Relieve pain.  Relieve nausea.  Prevent migraine headaches. Treatment for this condition may also include:  Acupuncture.  Lifestyle changes like avoiding foods that trigger migraine headaches.  Biofeedback.  Cognitive behavioral therapy. Follow these instructions at home: Medicines  Take over-the-counter  and prescription medicines only as told by your health care provider.  Ask your health care provider if the medicine prescribed to you: ? Requires you to avoid driving or using heavy machinery. ? Can cause constipation. You may need to take these  actions to prevent or treat constipation:  Drink enough fluid to keep your urine pale yellow.  Take over-the-counter or prescription medicines.  Eat foods that are high in fiber, such as beans, whole grains, and fresh fruits and vegetables.  Limit foods that are high in fat and processed sugars, such as fried or sweet foods. Lifestyle  Do not drink alcohol.  Do not use any products that contain nicotine or tobacco, such as cigarettes, e-cigarettes, and chewing tobacco. If you need help quitting, ask your health care provider.  Get at least 8 hours of sleep every night.  Find ways to manage stress, such as meditation, deep breathing, or yoga. General instructions      Keep a journal to find out what may trigger your migraine headaches. For example, write down: ? What you eat and drink. ? How much sleep you get. ? Any change to your diet or medicines.  If you have a migraine headache: ? Avoid things that make your symptoms worse, such as bright lights. ? It may help to lie down in a dark, quiet room. ? Do not drive or use heavy machinery. ? Ask your health care provider what activities are safe for you while you are experiencing symptoms.  Keep all follow-up visits as told by your health care provider. This is important. Contact a health care provider if:  You develop symptoms that are different or more severe than your usual migraine headache symptoms.  You have more than 15 headache days in one month. Get help right away if:  Your migraine headache becomes severe.  Your migraine headache lasts longer than 72 hours.  You have a fever.  You have a stiff neck.  You have vision loss.  Your muscles feel weak or like you cannot control them.  You start to lose your balance often.  You have trouble walking.  You faint.  You have a seizure. Summary  A migraine headache is an intense, throbbing pain on one side or both sides of the head. Migraines may also cause  other symptoms, such as nausea, vomiting, and sensitivity to light and noise.  This condition may be treated with medicines and lifestyle changes. You may also need to avoid certain things that trigger a migraine headache.  Keep a journal to find out what may trigger your migraine headaches.  Contact your health care provider if you have more than 15 headache days in a month or you develop symptoms that are different or more severe than your usual migraine headache symptoms. This information is not intended to replace advice given to you by your health care provider. Make sure you discuss any questions you have with your health care provider. Document Released: 03/29/2005 Document Revised: 07/21/2018 Document Reviewed: 05/11/2018 Elsevier Patient Education  2020 [employer]
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
## 2018-11-05 LAB — CBC
Hematocrit: 44.2 % (ref 34.0–46.6)
Hemoglobin: 14.5 g/dL (ref 11.1–15.9)
MCH: 29.5 pg (ref 26.6–33.0)
MCHC: 32.8 g/dL (ref 31.5–35.7)
MCV: 90 fL (ref 79–97)
Platelets: 240 10*3/uL (ref 150–450)
RBC: 4.92 x10E6/uL (ref 3.77–5.28)
RDW: 12.6 % (ref 11.7–15.4)
WBC: 4.4 10*3/uL (ref 3.4–10.8)

## 2018-11-05 LAB — VITAMIN B6: Vitamin B6: 24.5 ug/L (ref 2.0–32.8)

## 2018-11-05 LAB — B12 AND FOLATE PANEL
Folate: 7.9 ng/mL (ref >3.0)
Vitamin B-12: 1438 pg/mL — ABNORMAL HIGH (ref 232–1245)

## 2018-11-05 LAB — METHYLMALONIC ACID, SERUM: Methylmalonic Acid: 128 nmol/L (ref 0–378)

## 2018-11-05 LAB — COMPREHENSIVE METABOLIC PANEL
ALT: 14 IU/L (ref 0–32)
AST: 20 IU/L (ref 0–40)
Albumin/Globulin Ratio: 2.1 (ref 1.2–2.2)
Albumin: 4.6 g/dL (ref 3.8–4.8)
Alkaline Phosphatase: 59 IU/L (ref 39–117)
BUN/Creatinine Ratio: 15 (ref 9–23)
BUN: 12 mg/dL (ref 6–20)
Bilirubin Total: 0.2 mg/dL (ref 0.0–1.2)
CO2: 23 mmol/L (ref 20–29)
Calcium: 9.6 mg/dL (ref 8.7–10.2)
Chloride: 102 mmol/L (ref 96–106)
Creatinine, Ser: 0.79 mg/dL (ref 0.57–1.00)
GFR calc Af Amer: 115 mL/min/{1.73_m2} (ref >59)
GFR calc non Af Amer: 99 mL/min/{1.73_m2} (ref >59)
Globulin, Total: 2.2 g/dL (ref 1.5–4.5)
Glucose: 52 mg/dL — ABNORMAL LOW (ref 65–99)
Potassium: 4.4 mmol/L (ref 3.5–5.2)
Sodium: 141 mmol/L (ref 134–144)
Total Protein: 6.8 g/dL (ref 6.0–8.5)

## 2018-11-05 LAB — B. BURGDORFI ANTIBODIES: Lyme IgG/IgM Ab: <0.91 {ISR} (ref 0.00–0.90)

## 2018-11-05 LAB — HEAVY METALS, BLOOD
Arsenic: 6 ug/L (ref 2–23)
Lead, Blood: NOT DETECTED ug/dL (ref 0–4)
Mercury: NOT DETECTED ug/L (ref 0.0–14.9)

## 2018-11-05 LAB — TSH: TSH: 0.889 u[IU]/mL (ref 0.450–4.500)

## 2018-11-05 LAB — VITAMIN B1: Thiamine: 76.5 nmol/L (ref 66.5–200.0)

## 2018-11-06 ENCOUNTER — Telehealth: Payer: Self-pay

## 2018-11-06 NOTE — Telephone Encounter (Signed)
I reached out to the pt and advised of results. Pt verbalized understanding  

## 2018-11-06 NOTE — Telephone Encounter (Signed)
-----   Message from Melvenia Beam, MD sent at 11/05/2018 10:07 AM EDT ----- Labs unremarkable thanks

## 2018-12-08 ENCOUNTER — Other Ambulatory Visit: Payer: Self-pay

## 2018-12-08 ENCOUNTER — Ambulatory Visit
Admission: RE | Admit: 2018-12-08 | Discharge: 2018-12-08 | Disposition: A | Discharge status: Home / Self Care | Payer: Medicaid Other | Source: Ambulatory Visit | Attending: Neurology | Admitting: Neurology

## 2018-12-08 DIAGNOSIS — M79609 Pain in unspecified limb: Secondary | ICD-10-CM

## 2018-12-08 DIAGNOSIS — G441 Vascular headache, not elsewhere classified: Secondary | ICD-10-CM

## 2018-12-08 DIAGNOSIS — R200 Anesthesia of skin: Secondary | ICD-10-CM

## 2018-12-08 DIAGNOSIS — R531 Weakness: Secondary | ICD-10-CM

## 2018-12-08 DIAGNOSIS — H539 Unspecified visual disturbance: Secondary | ICD-10-CM

## 2018-12-08 DIAGNOSIS — M792 Neuralgia and neuritis, unspecified: Secondary | ICD-10-CM

## 2018-12-08 DIAGNOSIS — R202 Paresthesia of skin: Secondary | ICD-10-CM | POA: Diagnosis not present

## 2018-12-08 MED ORDER — GADOBENATE DIMEGLUMINE 529 MG/ML IV SOLN
13.0000 mL | Freq: Once | INTRAVENOUS | Status: AC | PRN
  Administered 2018-12-08: 14:00:00 13 mL via INTRAVENOUS

## 2018-12-11 ENCOUNTER — Telehealth: Payer: Self-pay | Admitting: Neurology

## 2018-12-11 NOTE — Telephone Encounter (Signed)
Hyperintensity in the cord may be syrinx or chronic traumatic unlikely inflammatory or neoplastic but repeat MRI c-spine 6 months  The cervical vertebrae demonstrate abnormal alignment with loss of forward curvature and posterior subluxation of C4 and C5 C2-3 vertebrae.  The intervertebral discs show normal signal characteristics.  The vertebral body heights and marrow signal characteristics appear normal.  There is no frank disc herniation, cord compression, or significant root of foraminal encroachment.  The spinal cord parenchyma shows a ill-defined area of hyperintensity in the center and slightly to the right of the cord at lower border of C6.  This does not show any postcontrast enhancement.  This is of unclear etiology and may represent possibly sequelae of old trauma.  The paraspinal soft tissue appear unremarkable.  Visualized portion of the lower brainstem, craniovertebral junction and upper thoracic spine appear unremarkable.  Postcontrast images do not show any abnormal areas of enhancement.     IMPRESSION: Slightly abnormal MRI scan cervical spine showing loss of forward curvature and area of nonenhancing ill-defined hyperintensity in the spinal cord centrally at C6 of unclear etiology

## 2018-12-13 ENCOUNTER — Telehealth: Payer: Self-pay | Admitting: *Deleted

## 2018-12-13 NOTE — Telephone Encounter (Signed)
Pt aware MRI brain normal.

## 2018-12-13 NOTE — Telephone Encounter (Signed)
-----   Message from Melvenia Beam, MD sent at 12/11/2018 10:49 AM EDT ----- Mri brain normal

## 2018-12-14 NOTE — Telephone Encounter (Signed)
Natasha Pena, patient had what looks like a scar in the cervical spine, very minimal, not concerning for anything serious, it may be from prior injury or something she was born with hard to say. I don;t think it is affecting her or causing her symptoms from where it is. It is minimal. Still I would recommend repeating MRI cervical spine in 6 months to make sure it doesn't change. thanks

## 2018-12-14 NOTE — Telephone Encounter (Signed)
We will follow up after the MRI or if symptoms worsen she needs to call asap

## 2018-12-14 NOTE — Telephone Encounter (Signed)
I called the pt & LVM (ok per DPR) and advised Dr. Jaynee Eagles would like her to f/u after the MRI or if symptoms worsen she needs to call asap. I recommended she schedule the follow up when her 6 month MRI is scheduled. Informed pt there is an oncall MD if needed after hours. Left office number in message.

## 2018-12-14 NOTE — Telephone Encounter (Signed)
I spoke with the patient and discussed Dr. Cathren Laine message in detail regarding MRI c-spine results and recommended repeat in 6 months. Pt is amenable to this. She stated that the scar information means a lot to her b/c of the way her body reacts to scar tissue. She stated scar tissues causes her body to not act right. In the past she had scar tissue removed from her R shoulder after it caused problems with motor skills, etc. Pt verbalized appreciation for the call. She will wait to f/u in office when MD advises.

## 2019-05-24 ENCOUNTER — Telehealth: Payer: Self-pay | Admitting: *Deleted

## 2019-05-24 NOTE — Telephone Encounter (Signed)
Per previous plan in Aug 2020, repeat MRI c-spine in 6 months. I called the pt per Dr. Lucia Gaskins, received no answer and vm was full. Will try to call the pt again later.   If pt calls back, please confirm if she is still in agreement to repeat the MRI cervical spine (neck) and if she is, tell her we will go ahead and order it.

## 2019-05-28 NOTE — Telephone Encounter (Signed)
Tried to reach pt again. No answer, vm full.

## 2019-05-28 NOTE — Telephone Encounter (Signed)
Just send a letter and ask her to contact our office, we have tried several times, in reference to repeating her cervical spine MRI thanks

## 2019-05-29 ENCOUNTER — Encounter: Payer: Self-pay | Admitting: *Deleted

## 2019-05-29 NOTE — Telephone Encounter (Signed)
Letter mailed to pt asking for her to contact regarding repeat imaging and follow-up in office.

## 2021-04-27 ENCOUNTER — Encounter: Payer: Self-pay | Admitting: Neurology

## 2021-04-27 ENCOUNTER — Ambulatory Visit (INDEPENDENT_AMBULATORY_CARE_PROVIDER_SITE_OTHER): Payer: Self-pay | Admitting: Neurology

## 2021-04-27 DIAGNOSIS — Z91199 Patient's noncompliance with other medical treatment and regimen due to unspecified reason: Secondary | ICD-10-CM

## 2021-04-27 NOTE — Progress Notes (Signed)
no showed her new patient appointment today.  She has an 13% no-show rate in epic.  This is her second no-show at least here at [facility], neuro.  If patient calls back please have a talk with her, we have a lot of patients awaiting appointments and its really not fair to them when people just do not show up, please ask her not to make an appointment unless she intends on coming.  Something could have happened today and in that case we totally understand and we are happy to see her back but if she no-shows again she will be dismissed from our practice.
